# Patient Record
Sex: Female | Born: 1945 | Race: Black or African American | Hispanic: No | Marital: Married | Smoking: Former smoker
Health system: Southern US, Community
[De-identification: ages and names within clinical notes are randomized; demographics above are authoritative.]

## PROBLEM LIST (undated history)

## (undated) DIAGNOSIS — I10 Essential (primary) hypertension: Secondary | ICD-10-CM

## (undated) DIAGNOSIS — K219 Gastro-esophageal reflux disease without esophagitis: Secondary | ICD-10-CM

## (undated) DIAGNOSIS — E785 Hyperlipidemia, unspecified: Secondary | ICD-10-CM

## (undated) HISTORY — DX: Essential (primary) hypertension: I10

## (undated) HISTORY — DX: Hyperlipidemia, unspecified: E78.5

## (undated) HISTORY — PX: TUBAL LIGATION: SHX77

## (undated) HISTORY — DX: Gastro-esophageal reflux disease without esophagitis: K21.9

---

## 1997-12-10 ENCOUNTER — Ambulatory Visit (HOSPITAL_COMMUNITY): Admission: RE | Admit: 1997-12-10 | Discharge: 1997-12-10 | Payer: Self-pay | Admitting: Family Medicine

## 1999-05-07 ENCOUNTER — Other Ambulatory Visit: Admission: RE | Admit: 1999-05-07 | Discharge: 1999-05-07 | Payer: Self-pay | Admitting: Obstetrics

## 2001-02-15 ENCOUNTER — Ambulatory Visit (HOSPITAL_COMMUNITY): Admission: RE | Admit: 2001-02-15 | Discharge: 2001-02-15 | Payer: Self-pay | Admitting: *Deleted

## 2001-02-15 ENCOUNTER — Encounter: Payer: Self-pay | Admitting: *Deleted

## 2001-07-24 ENCOUNTER — Emergency Department (HOSPITAL_COMMUNITY): Admission: EM | Admit: 2001-07-24 | Discharge: 2001-07-24 | Payer: Self-pay | Admitting: Emergency Medicine

## 2007-04-25 ENCOUNTER — Other Ambulatory Visit: Admission: RE | Admit: 2007-04-25 | Discharge: 2007-04-25 | Payer: Self-pay | Admitting: Family Medicine

## 2011-03-20 DIAGNOSIS — M546 Pain in thoracic spine: Secondary | ICD-10-CM | POA: Diagnosis not present

## 2011-03-20 DIAGNOSIS — J019 Acute sinusitis, unspecified: Secondary | ICD-10-CM | POA: Diagnosis not present

## 2011-04-23 DIAGNOSIS — E785 Hyperlipidemia, unspecified: Secondary | ICD-10-CM | POA: Diagnosis not present

## 2011-04-23 DIAGNOSIS — M542 Cervicalgia: Secondary | ICD-10-CM | POA: Diagnosis not present

## 2011-04-23 DIAGNOSIS — I10 Essential (primary) hypertension: Secondary | ICD-10-CM | POA: Diagnosis not present

## 2011-05-31 DIAGNOSIS — D492 Neoplasm of unspecified behavior of bone, soft tissue, and skin: Secondary | ICD-10-CM | POA: Diagnosis not present

## 2011-10-08 DIAGNOSIS — I10 Essential (primary) hypertension: Secondary | ICD-10-CM | POA: Diagnosis not present

## 2011-10-08 DIAGNOSIS — E785 Hyperlipidemia, unspecified: Secondary | ICD-10-CM | POA: Diagnosis not present

## 2012-01-26 DIAGNOSIS — I10 Essential (primary) hypertension: Secondary | ICD-10-CM | POA: Diagnosis not present

## 2012-01-26 DIAGNOSIS — J329 Chronic sinusitis, unspecified: Secondary | ICD-10-CM | POA: Diagnosis not present

## 2012-03-21 DIAGNOSIS — Z23 Encounter for immunization: Secondary | ICD-10-CM | POA: Diagnosis not present

## 2012-04-10 DIAGNOSIS — I10 Essential (primary) hypertension: Secondary | ICD-10-CM | POA: Diagnosis not present

## 2012-04-10 DIAGNOSIS — J329 Chronic sinusitis, unspecified: Secondary | ICD-10-CM | POA: Diagnosis not present

## 2012-04-10 DIAGNOSIS — E785 Hyperlipidemia, unspecified: Secondary | ICD-10-CM | POA: Diagnosis not present

## 2012-04-25 DIAGNOSIS — R3989 Other symptoms and signs involving the genitourinary system: Secondary | ICD-10-CM | POA: Diagnosis not present

## 2012-10-09 DIAGNOSIS — I10 Essential (primary) hypertension: Secondary | ICD-10-CM | POA: Diagnosis not present

## 2012-10-09 DIAGNOSIS — E785 Hyperlipidemia, unspecified: Secondary | ICD-10-CM | POA: Diagnosis not present

## 2012-10-09 DIAGNOSIS — K219 Gastro-esophageal reflux disease without esophagitis: Secondary | ICD-10-CM | POA: Diagnosis not present

## 2012-10-09 DIAGNOSIS — M549 Dorsalgia, unspecified: Secondary | ICD-10-CM | POA: Diagnosis not present

## 2012-10-16 DIAGNOSIS — I10 Essential (primary) hypertension: Secondary | ICD-10-CM | POA: Diagnosis not present

## 2012-12-19 DIAGNOSIS — Z23 Encounter for immunization: Secondary | ICD-10-CM | POA: Diagnosis not present

## 2013-04-09 DIAGNOSIS — M79609 Pain in unspecified limb: Secondary | ICD-10-CM | POA: Diagnosis not present

## 2013-04-23 DIAGNOSIS — I739 Peripheral vascular disease, unspecified: Secondary | ICD-10-CM | POA: Diagnosis not present

## 2013-04-23 DIAGNOSIS — I10 Essential (primary) hypertension: Secondary | ICD-10-CM | POA: Diagnosis not present

## 2013-04-23 DIAGNOSIS — E785 Hyperlipidemia, unspecified: Secondary | ICD-10-CM | POA: Diagnosis not present

## 2013-04-26 ENCOUNTER — Other Ambulatory Visit: Payer: Self-pay | Admitting: Family Medicine

## 2013-04-26 DIAGNOSIS — M79606 Pain in leg, unspecified: Secondary | ICD-10-CM

## 2013-05-02 ENCOUNTER — Other Ambulatory Visit: Payer: Self-pay

## 2013-05-28 ENCOUNTER — Ambulatory Visit
Admission: RE | Admit: 2013-05-28 | Discharge: 2013-05-28 | Disposition: A | Discharge status: Home / Self Care | Payer: Medicare Other | Source: Ambulatory Visit | Attending: Family Medicine | Admitting: Family Medicine

## 2013-05-28 DIAGNOSIS — M79609 Pain in unspecified limb: Secondary | ICD-10-CM | POA: Diagnosis not present

## 2013-05-28 DIAGNOSIS — M79606 Pain in leg, unspecified: Secondary | ICD-10-CM

## 2013-06-05 ENCOUNTER — Other Ambulatory Visit: Payer: Self-pay | Admitting: *Deleted

## 2013-06-05 DIAGNOSIS — I739 Peripheral vascular disease, unspecified: Secondary | ICD-10-CM

## 2013-06-20 ENCOUNTER — Encounter: Payer: Self-pay | Admitting: Vascular Surgery

## 2013-07-09 ENCOUNTER — Encounter: Payer: Self-pay | Admitting: Vascular Surgery

## 2013-07-10 ENCOUNTER — Ambulatory Visit (INDEPENDENT_AMBULATORY_CARE_PROVIDER_SITE_OTHER): Payer: Medicare Other | Admitting: Vascular Surgery

## 2013-07-10 ENCOUNTER — Encounter: Payer: Self-pay | Admitting: Vascular Surgery

## 2013-07-10 ENCOUNTER — Ambulatory Visit (HOSPITAL_COMMUNITY)
Admission: RE | Admit: 2013-07-10 | Discharge: 2013-07-10 | Disposition: A | Discharge status: Home / Self Care | Payer: Medicare Other | Source: Ambulatory Visit | Attending: Vascular Surgery | Admitting: Vascular Surgery

## 2013-07-10 VITALS — BP 155/80 | HR 62 | Ht 63.0 in | Wt 211.5 lb

## 2013-07-10 DIAGNOSIS — I739 Peripheral vascular disease, unspecified: Secondary | ICD-10-CM | POA: Diagnosis not present

## 2013-07-10 NOTE — Progress Notes (Signed)
Subjective:     Patient ID: Tiffany Murray, female   DOB: [birth year], 68 y.o.   MRN: [id number]  HPI this 68 year old female was referred by Dr. Kenton Kingfisher for evaluation of possible lower extremity occlusive disease. Patient states that in January she began having leg cramps at night. These would eventually resolve but occasionally if she got up and ambulated it would improve. She has no history of rest pain, nonhealing ulcers, gangrene, or infection. She does not ambulate much. She denies claudication symptoms. She states her leg cramps and now resolved.  Past Medical History  Diagnosis Date  . Hypertension   . Esophageal reflux   . Hyperlipidemia     History  Substance Use Topics  . Smoking status: Former Smoker -- 1.00 packs/day for 10 years    Types: Cigarettes    Quit date: 06/20/1988  . Smokeless tobacco: Never Used  . Alcohol Use: No    Family History  Problem Relation Age of Onset  . Hyperlipidemia Mother   . Hypertension Mother   . Cancer Father   . Depression Sister     Allergies  Allergen Reactions  . Ceftin [Cefuroxime Axetil] Diarrhea  . Ciprofloxacin Swelling    Swelling in feet  . Meloxicam Other (See Comments)    Causes GI upset  . Zantac [Ranitidine Hcl]     Causes dizziness  . Penicillins Rash    Current outpatient prescriptions:acetaminophen (TYLENOL) 325 MG tablet, Take 650 mg by mouth every 6 (six) hours as needed., Disp: , Rfl: ;  bisoprolol-hydrochlorothiazide (ZIAC) 10-6.25 MG per tablet, Take 1 tablet by mouth daily., Disp: , Rfl: ;  loratadine (CLARITIN) 10 MG tablet, Take 10 mg by mouth daily as needed for allergies., Disp: , Rfl: ;  simvastatin (ZOCOR) 40 MG tablet, Take 40 mg by mouth daily., Disp: , Rfl:  traMADol (ULTRAM) 50 MG tablet, Take 50 mg by mouth 2 (two) times daily. 1-2 tablets as needed for pain, Disp: , Rfl:   BP 155/80  Pulse 62  Ht 5\' 3"  (1.6 m)  Wt 211 lb 8 oz (95.936 kg)  BMI 37.48 kg/m2  SpO2 100%  Body mass index is  37.48 kg/(m^2).           Review of Systems denies chest pain, dyspnea on exertion, PND, orthopnea, hemoptysis, lateralizing weakness, aphasia, does have occasional back discomfort. Other systems negative and complete review of systems    Objective:   Physical Exam BP 155/80  Pulse 62  Ht 5\' 3"  (1.6 m)  Wt 211 lb 8 oz (95.936 kg)  BMI 37.48 kg/m2  SpO2 100%  Gen.-alert and oriented x3 in no apparent distress HEENT normal for age Lungs no rhonchi or wheezing Cardiovascular regular rhythm no murmurs carotid pulses 3+ palpable no bruits audible Abdomen soft nontender no palpable masses-obese Musculoskeletal free of  major deformities Skin clear -no rashes Neurologic normal Lower extremities 3+ femoral pulses palpable bilaterally. Right dorsalis pedis 2+ left dorsalis pedis 3+. No evidence infection, gangrene, or ischemia.  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

## 2013-10-16 DIAGNOSIS — E785 Hyperlipidemia, unspecified: Secondary | ICD-10-CM | POA: Diagnosis not present

## 2013-10-16 DIAGNOSIS — I10 Essential (primary) hypertension: Secondary | ICD-10-CM | POA: Diagnosis not present

## 2013-10-16 DIAGNOSIS — I739 Peripheral vascular disease, unspecified: Secondary | ICD-10-CM | POA: Diagnosis not present

## 2014-01-08 DIAGNOSIS — Z23 Encounter for immunization: Secondary | ICD-10-CM | POA: Diagnosis not present

## 2014-04-12 DIAGNOSIS — I10 Essential (primary) hypertension: Secondary | ICD-10-CM | POA: Diagnosis not present

## 2014-04-12 DIAGNOSIS — I739 Peripheral vascular disease, unspecified: Secondary | ICD-10-CM | POA: Diagnosis not present

## 2014-04-12 DIAGNOSIS — E785 Hyperlipidemia, unspecified: Secondary | ICD-10-CM | POA: Diagnosis not present

## 2014-04-12 DIAGNOSIS — K219 Gastro-esophageal reflux disease without esophagitis: Secondary | ICD-10-CM | POA: Diagnosis not present

## 2014-10-11 DIAGNOSIS — Z1211 Encounter for screening for malignant neoplasm of colon: Secondary | ICD-10-CM | POA: Diagnosis not present

## 2014-10-11 DIAGNOSIS — E785 Hyperlipidemia, unspecified: Secondary | ICD-10-CM | POA: Diagnosis not present

## 2014-10-11 DIAGNOSIS — I10 Essential (primary) hypertension: Secondary | ICD-10-CM | POA: Diagnosis not present

## 2015-02-17 DIAGNOSIS — Z23 Encounter for immunization: Secondary | ICD-10-CM | POA: Diagnosis not present

## 2015-04-14 DIAGNOSIS — I739 Peripheral vascular disease, unspecified: Secondary | ICD-10-CM | POA: Diagnosis not present

## 2015-04-14 DIAGNOSIS — E785 Hyperlipidemia, unspecified: Secondary | ICD-10-CM | POA: Diagnosis not present

## 2015-04-14 DIAGNOSIS — I10 Essential (primary) hypertension: Secondary | ICD-10-CM | POA: Diagnosis not present

## 2015-04-14 DIAGNOSIS — N390 Urinary tract infection, site not specified: Secondary | ICD-10-CM | POA: Diagnosis not present

## 2015-05-02 DIAGNOSIS — R198 Other specified symptoms and signs involving the digestive system and abdomen: Secondary | ICD-10-CM | POA: Diagnosis not present

## 2015-07-28 DIAGNOSIS — J320 Chronic maxillary sinusitis: Secondary | ICD-10-CM | POA: Diagnosis not present

## 2015-09-17 DIAGNOSIS — B372 Candidiasis of skin and nail: Secondary | ICD-10-CM | POA: Diagnosis not present

## 2015-10-10 DIAGNOSIS — I739 Peripheral vascular disease, unspecified: Secondary | ICD-10-CM | POA: Diagnosis not present

## 2015-10-10 DIAGNOSIS — E785 Hyperlipidemia, unspecified: Secondary | ICD-10-CM | POA: Diagnosis not present

## 2015-10-10 DIAGNOSIS — I10 Essential (primary) hypertension: Secondary | ICD-10-CM | POA: Diagnosis not present

## 2015-12-10 DIAGNOSIS — Z23 Encounter for immunization: Secondary | ICD-10-CM | POA: Diagnosis not present

## 2016-04-12 DIAGNOSIS — I739 Peripheral vascular disease, unspecified: Secondary | ICD-10-CM | POA: Diagnosis not present

## 2016-04-12 DIAGNOSIS — Z1231 Encounter for screening mammogram for malignant neoplasm of breast: Secondary | ICD-10-CM | POA: Diagnosis not present

## 2016-04-12 DIAGNOSIS — E7800 Pure hypercholesterolemia, unspecified: Secondary | ICD-10-CM | POA: Diagnosis not present

## 2016-04-12 DIAGNOSIS — I10 Essential (primary) hypertension: Secondary | ICD-10-CM | POA: Diagnosis not present

## 2016-05-05 DIAGNOSIS — N3001 Acute cystitis with hematuria: Secondary | ICD-10-CM | POA: Diagnosis not present

## 2016-05-07 DIAGNOSIS — R300 Dysuria: Secondary | ICD-10-CM | POA: Diagnosis not present

## 2016-05-07 DIAGNOSIS — B373 Candidiasis of vulva and vagina: Secondary | ICD-10-CM | POA: Diagnosis not present

## 2016-10-19 DIAGNOSIS — Z6833 Body mass index (BMI) 33.0-33.9, adult: Secondary | ICD-10-CM | POA: Diagnosis not present

## 2016-10-19 DIAGNOSIS — E6609 Other obesity due to excess calories: Secondary | ICD-10-CM | POA: Diagnosis not present

## 2016-10-19 DIAGNOSIS — Z1211 Encounter for screening for malignant neoplasm of colon: Secondary | ICD-10-CM | POA: Diagnosis not present

## 2016-10-19 DIAGNOSIS — I739 Peripheral vascular disease, unspecified: Secondary | ICD-10-CM | POA: Diagnosis not present

## 2016-10-19 DIAGNOSIS — E7800 Pure hypercholesterolemia, unspecified: Secondary | ICD-10-CM | POA: Diagnosis not present

## 2016-10-19 DIAGNOSIS — I10 Essential (primary) hypertension: Secondary | ICD-10-CM | POA: Diagnosis not present

## 2016-10-19 DIAGNOSIS — Z1389 Encounter for screening for other disorder: Secondary | ICD-10-CM | POA: Diagnosis not present

## 2016-12-14 DIAGNOSIS — Z23 Encounter for immunization: Secondary | ICD-10-CM | POA: Diagnosis not present

## 2016-12-27 DIAGNOSIS — J0100 Acute maxillary sinusitis, unspecified: Secondary | ICD-10-CM | POA: Diagnosis not present

## 2017-03-09 DIAGNOSIS — J069 Acute upper respiratory infection, unspecified: Secondary | ICD-10-CM | POA: Diagnosis not present

## 2017-03-09 DIAGNOSIS — N3000 Acute cystitis without hematuria: Secondary | ICD-10-CM | POA: Diagnosis not present

## 2017-03-09 DIAGNOSIS — R399 Unspecified symptoms and signs involving the genitourinary system: Secondary | ICD-10-CM | POA: Diagnosis not present

## 2017-03-09 DIAGNOSIS — M545 Low back pain: Secondary | ICD-10-CM | POA: Diagnosis not present

## 2017-04-21 DIAGNOSIS — I739 Peripheral vascular disease, unspecified: Secondary | ICD-10-CM | POA: Diagnosis not present

## 2017-04-21 DIAGNOSIS — E7800 Pure hypercholesterolemia, unspecified: Secondary | ICD-10-CM | POA: Diagnosis not present

## 2017-04-21 DIAGNOSIS — M545 Low back pain: Secondary | ICD-10-CM | POA: Diagnosis not present

## 2017-04-21 DIAGNOSIS — I10 Essential (primary) hypertension: Secondary | ICD-10-CM | POA: Diagnosis not present

## 2017-04-21 DIAGNOSIS — Z23 Encounter for immunization: Secondary | ICD-10-CM | POA: Diagnosis not present

## 2017-10-19 DIAGNOSIS — E7800 Pure hypercholesterolemia, unspecified: Secondary | ICD-10-CM | POA: Diagnosis not present

## 2017-10-19 DIAGNOSIS — I10 Essential (primary) hypertension: Secondary | ICD-10-CM | POA: Diagnosis not present

## 2017-10-19 DIAGNOSIS — I739 Peripheral vascular disease, unspecified: Secondary | ICD-10-CM | POA: Diagnosis not present

## 2017-10-19 DIAGNOSIS — Z6838 Body mass index (BMI) 38.0-38.9, adult: Secondary | ICD-10-CM | POA: Diagnosis not present

## 2017-11-23 DIAGNOSIS — E876 Hypokalemia: Secondary | ICD-10-CM | POA: Diagnosis not present

## 2017-12-20 DIAGNOSIS — Z23 Encounter for immunization: Secondary | ICD-10-CM | POA: Diagnosis not present

## 2018-04-27 DIAGNOSIS — I10 Essential (primary) hypertension: Secondary | ICD-10-CM | POA: Diagnosis not present

## 2018-04-27 DIAGNOSIS — E7800 Pure hypercholesterolemia, unspecified: Secondary | ICD-10-CM | POA: Diagnosis not present

## 2018-04-27 DIAGNOSIS — I739 Peripheral vascular disease, unspecified: Secondary | ICD-10-CM | POA: Diagnosis not present

## 2018-11-01 DIAGNOSIS — I10 Essential (primary) hypertension: Secondary | ICD-10-CM | POA: Diagnosis not present

## 2018-11-01 DIAGNOSIS — E7800 Pure hypercholesterolemia, unspecified: Secondary | ICD-10-CM | POA: Diagnosis not present

## 2018-11-01 DIAGNOSIS — I739 Peripheral vascular disease, unspecified: Secondary | ICD-10-CM | POA: Diagnosis not present

## 2018-12-04 DIAGNOSIS — E7800 Pure hypercholesterolemia, unspecified: Secondary | ICD-10-CM | POA: Diagnosis not present

## 2018-12-04 DIAGNOSIS — Z23 Encounter for immunization: Secondary | ICD-10-CM | POA: Diagnosis not present

## 2018-12-04 DIAGNOSIS — I10 Essential (primary) hypertension: Secondary | ICD-10-CM | POA: Diagnosis not present

## 2019-05-02 DIAGNOSIS — I10 Essential (primary) hypertension: Secondary | ICD-10-CM | POA: Diagnosis not present

## 2019-05-02 DIAGNOSIS — I739 Peripheral vascular disease, unspecified: Secondary | ICD-10-CM | POA: Diagnosis not present

## 2019-05-02 DIAGNOSIS — E7800 Pure hypercholesterolemia, unspecified: Secondary | ICD-10-CM | POA: Diagnosis not present

## 2021-03-30 ENCOUNTER — Other Ambulatory Visit: Payer: Self-pay | Admitting: Family Medicine

## 2021-03-30 ENCOUNTER — Ambulatory Visit
Admission: RE | Admit: 2021-03-30 | Discharge: 2021-03-30 | Disposition: A | Discharge status: Home / Self Care | Payer: Medicare Other | Source: Ambulatory Visit | Attending: Family Medicine | Admitting: Family Medicine

## 2021-03-30 DIAGNOSIS — M5416 Radiculopathy, lumbar region: Secondary | ICD-10-CM

## 2021-07-21 DIAGNOSIS — I10 Essential (primary) hypertension: Secondary | ICD-10-CM | POA: Diagnosis not present

## 2021-07-21 DIAGNOSIS — E7800 Pure hypercholesterolemia, unspecified: Secondary | ICD-10-CM | POA: Diagnosis not present

## 2021-07-21 DIAGNOSIS — Z0000 Encounter for general adult medical examination without abnormal findings: Secondary | ICD-10-CM | POA: Diagnosis not present

## 2021-07-21 DIAGNOSIS — I739 Peripheral vascular disease, unspecified: Secondary | ICD-10-CM | POA: Diagnosis not present

## 2021-07-21 DIAGNOSIS — M8949 Other hypertrophic osteoarthropathy, multiple sites: Secondary | ICD-10-CM | POA: Diagnosis not present

## 2021-07-21 DIAGNOSIS — Z1211 Encounter for screening for malignant neoplasm of colon: Secondary | ICD-10-CM | POA: Diagnosis not present

## 2021-10-05 DIAGNOSIS — M5451 Vertebrogenic low back pain: Secondary | ICD-10-CM | POA: Diagnosis not present

## 2021-10-05 DIAGNOSIS — M7062 Trochanteric bursitis, left hip: Secondary | ICD-10-CM | POA: Diagnosis not present

## 2021-12-18 DIAGNOSIS — R9389 Abnormal findings on diagnostic imaging of other specified body structures: Secondary | ICD-10-CM | POA: Diagnosis not present

## 2022-01-25 DIAGNOSIS — I739 Peripheral vascular disease, unspecified: Secondary | ICD-10-CM | POA: Diagnosis not present

## 2022-01-25 DIAGNOSIS — E7800 Pure hypercholesterolemia, unspecified: Secondary | ICD-10-CM | POA: Diagnosis not present

## 2022-01-25 DIAGNOSIS — Z23 Encounter for immunization: Secondary | ICD-10-CM | POA: Diagnosis not present

## 2022-01-25 DIAGNOSIS — M8949 Other hypertrophic osteoarthropathy, multiple sites: Secondary | ICD-10-CM | POA: Diagnosis not present

## 2022-01-25 DIAGNOSIS — I10 Essential (primary) hypertension: Secondary | ICD-10-CM | POA: Diagnosis not present

## 2022-04-28 DIAGNOSIS — E876 Hypokalemia: Secondary | ICD-10-CM | POA: Diagnosis not present

## 2022-04-28 DIAGNOSIS — D729 Disorder of white blood cells, unspecified: Secondary | ICD-10-CM | POA: Diagnosis not present

## 2022-04-28 DIAGNOSIS — R739 Hyperglycemia, unspecified: Secondary | ICD-10-CM | POA: Diagnosis not present

## 2022-06-07 DIAGNOSIS — E876 Hypokalemia: Secondary | ICD-10-CM | POA: Diagnosis not present

## 2022-07-30 DIAGNOSIS — Z0000 Encounter for general adult medical examination without abnormal findings: Secondary | ICD-10-CM | POA: Diagnosis not present

## 2022-07-30 DIAGNOSIS — I739 Peripheral vascular disease, unspecified: Secondary | ICD-10-CM | POA: Diagnosis not present

## 2022-07-30 DIAGNOSIS — Z9989 Dependence on other enabling machines and devices: Secondary | ICD-10-CM | POA: Diagnosis not present

## 2023-02-02 DIAGNOSIS — E7800 Pure hypercholesterolemia, unspecified: Secondary | ICD-10-CM | POA: Diagnosis not present

## 2023-02-02 DIAGNOSIS — I10 Essential (primary) hypertension: Secondary | ICD-10-CM | POA: Diagnosis not present

## 2023-02-02 DIAGNOSIS — G8929 Other chronic pain: Secondary | ICD-10-CM | POA: Diagnosis not present

## 2023-02-02 DIAGNOSIS — I739 Peripheral vascular disease, unspecified: Secondary | ICD-10-CM | POA: Diagnosis not present

## 2023-02-02 DIAGNOSIS — M5442 Lumbago with sciatica, left side: Secondary | ICD-10-CM | POA: Diagnosis not present

## 2023-02-02 DIAGNOSIS — M5441 Lumbago with sciatica, right side: Secondary | ICD-10-CM | POA: Diagnosis not present

## 2023-02-02 DIAGNOSIS — Z23 Encounter for immunization: Secondary | ICD-10-CM | POA: Diagnosis not present

## 2023-04-29 DIAGNOSIS — J069 Acute upper respiratory infection, unspecified: Secondary | ICD-10-CM | POA: Diagnosis not present

## 2023-04-29 DIAGNOSIS — R5381 Other malaise: Secondary | ICD-10-CM | POA: Diagnosis not present

## 2023-04-29 DIAGNOSIS — R051 Acute cough: Secondary | ICD-10-CM | POA: Diagnosis not present

## 2023-04-29 DIAGNOSIS — Z03818 Encounter for observation for suspected exposure to other biological agents ruled out: Secondary | ICD-10-CM | POA: Diagnosis not present

## 2023-08-12 DIAGNOSIS — I10 Essential (primary) hypertension: Secondary | ICD-10-CM | POA: Diagnosis not present

## 2023-08-12 DIAGNOSIS — E7800 Pure hypercholesterolemia, unspecified: Secondary | ICD-10-CM | POA: Diagnosis not present

## 2023-08-12 DIAGNOSIS — I739 Peripheral vascular disease, unspecified: Secondary | ICD-10-CM | POA: Diagnosis not present

## 2023-08-12 DIAGNOSIS — M25561 Pain in right knee: Secondary | ICD-10-CM | POA: Diagnosis not present

## 2023-08-12 DIAGNOSIS — Z0000 Encounter for general adult medical examination without abnormal findings: Secondary | ICD-10-CM | POA: Diagnosis not present

## 2023-10-06 DIAGNOSIS — E7800 Pure hypercholesterolemia, unspecified: Secondary | ICD-10-CM | POA: Diagnosis not present

## 2023-10-06 DIAGNOSIS — I10 Essential (primary) hypertension: Secondary | ICD-10-CM | POA: Diagnosis not present

## 2023-11-06 DIAGNOSIS — I10 Essential (primary) hypertension: Secondary | ICD-10-CM | POA: Diagnosis not present

## 2023-11-06 DIAGNOSIS — E7800 Pure hypercholesterolemia, unspecified: Secondary | ICD-10-CM | POA: Diagnosis not present

## 2023-12-06 DIAGNOSIS — E7800 Pure hypercholesterolemia, unspecified: Secondary | ICD-10-CM | POA: Diagnosis not present

## 2023-12-06 DIAGNOSIS — I10 Essential (primary) hypertension: Secondary | ICD-10-CM | POA: Diagnosis not present
